# Patient Record
Sex: Female | Born: 1975 | Hispanic: No | Marital: Single
Health system: Southern US, Community
[De-identification: ages and names within clinical notes are randomized; demographics above are authoritative.]

---

## 2005-07-29 ENCOUNTER — Other Ambulatory Visit: Admission: RE | Admit: 2005-07-29 | Discharge: 2005-07-29 | Payer: Self-pay | Admitting: Obstetrics and Gynecology

## 2005-08-12 ENCOUNTER — Encounter: Admission: RE | Admit: 2005-08-12 | Discharge: 2005-08-12 | Payer: Self-pay | Admitting: Internal Medicine

## 2006-02-27 ENCOUNTER — Other Ambulatory Visit: Admission: RE | Admit: 2006-02-27 | Discharge: 2006-02-27 | Payer: Self-pay | Admitting: Obstetrics and Gynecology

## 2006-04-03 ENCOUNTER — Emergency Department (HOSPITAL_COMMUNITY): Admission: EM | Admit: 2006-04-03 | Discharge: 2006-04-03 | Payer: Self-pay | Admitting: Family Medicine

## 2006-11-30 ENCOUNTER — Other Ambulatory Visit: Admission: RE | Admit: 2006-11-30 | Discharge: 2006-11-30 | Payer: Self-pay | Admitting: Obstetrics and Gynecology

## 2007-07-25 ENCOUNTER — Other Ambulatory Visit: Admission: RE | Admit: 2007-07-25 | Discharge: 2007-07-25 | Payer: Self-pay | Admitting: Obstetrics and Gynecology

## 2007-07-31 ENCOUNTER — Encounter: Admission: RE | Admit: 2007-07-31 | Discharge: 2007-07-31 | Payer: Self-pay | Admitting: Obstetrics and Gynecology

## 2008-07-28 ENCOUNTER — Other Ambulatory Visit: Admission: RE | Admit: 2008-07-28 | Discharge: 2008-07-28 | Payer: Self-pay | Admitting: Obstetrics and Gynecology

## 2009-06-13 ENCOUNTER — Emergency Department (HOSPITAL_COMMUNITY): Admission: EM | Admit: 2009-06-13 | Discharge: 2009-06-13 | Payer: Self-pay | Admitting: Emergency Medicine

## 2009-06-15 ENCOUNTER — Encounter: Admission: RE | Admit: 2009-06-15 | Discharge: 2009-06-15 | Payer: Self-pay | Admitting: Internal Medicine

## 2010-05-10 ENCOUNTER — Other Ambulatory Visit: Admission: RE | Admit: 2010-05-10 | Discharge: 2010-05-10 | Payer: Self-pay | Admitting: Obstetrics and Gynecology

## 2010-09-19 ENCOUNTER — Encounter: Payer: Self-pay | Admitting: Internal Medicine

## 2010-12-02 LAB — URINALYSIS, ROUTINE W REFLEX MICROSCOPIC
Bilirubin Urine: NEGATIVE
Glucose, UA: NEGATIVE mg/dL
Hgb urine dipstick: NEGATIVE
Ketones, ur: NEGATIVE mg/dL
Nitrite: NEGATIVE
Protein, ur: NEGATIVE mg/dL
Specific Gravity, Urine: 1.008 (ref 1.005–1.030)
Urobilinogen, UA: 0.2 mg/dL (ref 0.0–1.0)
pH: 6.5 (ref 5.0–8.0)

## 2010-12-02 LAB — POCT PREGNANCY, URINE: Preg Test, Ur: NEGATIVE

## 2011-10-13 ENCOUNTER — Other Ambulatory Visit: Payer: Self-pay | Admitting: Obstetrics and Gynecology

## 2011-10-13 ENCOUNTER — Other Ambulatory Visit (HOSPITAL_COMMUNITY)
Admission: RE | Admit: 2011-10-13 | Discharge: 2011-10-13 | Disposition: A | Discharge status: Home / Self Care | Payer: BC Managed Care – PPO | Source: Ambulatory Visit | Attending: Obstetrics and Gynecology | Admitting: Obstetrics and Gynecology

## 2011-10-13 DIAGNOSIS — Z01419 Encounter for gynecological examination (general) (routine) without abnormal findings: Secondary | ICD-10-CM | POA: Insufficient documentation

## 2012-11-12 ENCOUNTER — Other Ambulatory Visit (HOSPITAL_COMMUNITY)
Admission: RE | Admit: 2012-11-12 | Discharge: 2012-11-12 | Disposition: A | Discharge status: Home / Self Care | Payer: BC Managed Care – PPO | Source: Ambulatory Visit | Attending: Obstetrics and Gynecology | Admitting: Obstetrics and Gynecology

## 2012-11-12 ENCOUNTER — Other Ambulatory Visit: Payer: Self-pay | Admitting: Obstetrics and Gynecology

## 2012-11-12 DIAGNOSIS — Z01419 Encounter for gynecological examination (general) (routine) without abnormal findings: Secondary | ICD-10-CM | POA: Insufficient documentation

## 2013-11-13 ENCOUNTER — Other Ambulatory Visit: Payer: Self-pay | Admitting: Obstetrics and Gynecology

## 2013-11-13 ENCOUNTER — Other Ambulatory Visit (HOSPITAL_COMMUNITY)
Admission: RE | Admit: 2013-11-13 | Discharge: 2013-11-13 | Disposition: A | Discharge status: Home / Self Care | Payer: BC Managed Care – PPO | Source: Ambulatory Visit | Attending: Obstetrics and Gynecology | Admitting: Obstetrics and Gynecology

## 2013-11-13 DIAGNOSIS — Z1151 Encounter for screening for human papillomavirus (HPV): Secondary | ICD-10-CM | POA: Insufficient documentation

## 2013-11-13 DIAGNOSIS — Z01419 Encounter for gynecological examination (general) (routine) without abnormal findings: Secondary | ICD-10-CM | POA: Insufficient documentation

## 2014-11-28 ENCOUNTER — Other Ambulatory Visit (HOSPITAL_COMMUNITY)
Admission: RE | Admit: 2014-11-28 | Discharge: 2014-11-28 | Disposition: A | Discharge status: Home / Self Care | Payer: BLUE CROSS/BLUE SHIELD | Source: Ambulatory Visit | Attending: Obstetrics and Gynecology | Admitting: Obstetrics and Gynecology

## 2014-11-28 ENCOUNTER — Other Ambulatory Visit: Payer: Self-pay | Admitting: Obstetrics and Gynecology

## 2014-11-28 DIAGNOSIS — Z01419 Encounter for gynecological examination (general) (routine) without abnormal findings: Secondary | ICD-10-CM | POA: Insufficient documentation

## 2014-12-01 LAB — CYTOLOGY - PAP

## 2015-12-18 DIAGNOSIS — R112 Nausea with vomiting, unspecified: Secondary | ICD-10-CM | POA: Diagnosis not present

## 2015-12-18 DIAGNOSIS — B349 Viral infection, unspecified: Secondary | ICD-10-CM | POA: Diagnosis not present

## 2015-12-28 ENCOUNTER — Other Ambulatory Visit: Payer: Self-pay | Admitting: Obstetrics and Gynecology

## 2015-12-28 ENCOUNTER — Other Ambulatory Visit (HOSPITAL_COMMUNITY)
Admission: RE | Admit: 2015-12-28 | Discharge: 2015-12-28 | Disposition: A | Discharge status: Home / Self Care | Payer: BLUE CROSS/BLUE SHIELD | Source: Ambulatory Visit | Attending: Obstetrics and Gynecology | Admitting: Obstetrics and Gynecology

## 2015-12-28 DIAGNOSIS — Z01419 Encounter for gynecological examination (general) (routine) without abnormal findings: Secondary | ICD-10-CM | POA: Diagnosis not present

## 2015-12-31 LAB — CYTOLOGY - PAP

## 2016-02-15 DIAGNOSIS — Z0000 Encounter for general adult medical examination without abnormal findings: Secondary | ICD-10-CM | POA: Diagnosis not present

## 2017-02-15 ENCOUNTER — Other Ambulatory Visit: Payer: Self-pay | Admitting: Obstetrics and Gynecology

## 2017-02-15 ENCOUNTER — Other Ambulatory Visit (HOSPITAL_COMMUNITY)
Admission: RE | Admit: 2017-02-15 | Discharge: 2017-02-15 | Disposition: A | Discharge status: Home / Self Care | Payer: BLUE CROSS/BLUE SHIELD | Source: Ambulatory Visit | Attending: Obstetrics and Gynecology | Admitting: Obstetrics and Gynecology

## 2017-02-15 DIAGNOSIS — Z01419 Encounter for gynecological examination (general) (routine) without abnormal findings: Secondary | ICD-10-CM | POA: Diagnosis not present

## 2017-02-15 DIAGNOSIS — Z124 Encounter for screening for malignant neoplasm of cervix: Secondary | ICD-10-CM | POA: Insufficient documentation

## 2017-02-17 LAB — CYTOLOGY - PAP
DIAGNOSIS: NEGATIVE
HPV: NOT DETECTED

## 2017-03-02 DIAGNOSIS — Z136 Encounter for screening for cardiovascular disorders: Secondary | ICD-10-CM | POA: Diagnosis not present

## 2017-03-02 DIAGNOSIS — Z0000 Encounter for general adult medical examination without abnormal findings: Secondary | ICD-10-CM | POA: Diagnosis not present

## 2018-02-16 DIAGNOSIS — Z01419 Encounter for gynecological examination (general) (routine) without abnormal findings: Secondary | ICD-10-CM | POA: Diagnosis not present

## 2018-03-09 ENCOUNTER — Other Ambulatory Visit: Payer: Self-pay | Admitting: Internal Medicine

## 2018-03-09 DIAGNOSIS — Z1231 Encounter for screening mammogram for malignant neoplasm of breast: Secondary | ICD-10-CM

## 2018-03-09 DIAGNOSIS — Z0000 Encounter for general adult medical examination without abnormal findings: Secondary | ICD-10-CM | POA: Diagnosis not present

## 2018-03-09 DIAGNOSIS — Z13220 Encounter for screening for lipoid disorders: Secondary | ICD-10-CM | POA: Diagnosis not present

## 2018-03-29 ENCOUNTER — Ambulatory Visit
Admission: RE | Admit: 2018-03-29 | Discharge: 2018-03-29 | Disposition: A | Discharge status: Home / Self Care | Payer: BLUE CROSS/BLUE SHIELD | Source: Ambulatory Visit | Attending: Internal Medicine | Admitting: Internal Medicine

## 2018-03-29 DIAGNOSIS — Z1231 Encounter for screening mammogram for malignant neoplasm of breast: Secondary | ICD-10-CM | POA: Diagnosis not present

## 2018-04-02 ENCOUNTER — Other Ambulatory Visit: Payer: Self-pay | Admitting: Internal Medicine

## 2018-04-02 DIAGNOSIS — R928 Other abnormal and inconclusive findings on diagnostic imaging of breast: Secondary | ICD-10-CM

## 2018-04-12 ENCOUNTER — Ambulatory Visit
Admission: RE | Admit: 2018-04-12 | Discharge: 2018-04-12 | Disposition: A | Discharge status: Home / Self Care | Payer: BLUE CROSS/BLUE SHIELD | Source: Ambulatory Visit | Attending: Internal Medicine | Admitting: Internal Medicine

## 2018-04-12 DIAGNOSIS — N6002 Solitary cyst of left breast: Secondary | ICD-10-CM | POA: Diagnosis not present

## 2018-04-12 DIAGNOSIS — R928 Other abnormal and inconclusive findings on diagnostic imaging of breast: Secondary | ICD-10-CM

## 2018-04-12 DIAGNOSIS — R922 Inconclusive mammogram: Secondary | ICD-10-CM | POA: Diagnosis not present

## 2018-08-14 ENCOUNTER — Ambulatory Visit
Admission: RE | Admit: 2018-08-14 | Discharge: 2018-08-14 | Disposition: A | Discharge status: Home / Self Care | Payer: BLUE CROSS/BLUE SHIELD | Source: Ambulatory Visit | Attending: Internal Medicine | Admitting: Internal Medicine

## 2018-08-14 ENCOUNTER — Other Ambulatory Visit: Payer: Self-pay | Admitting: Internal Medicine

## 2018-08-14 DIAGNOSIS — R109 Unspecified abdominal pain: Secondary | ICD-10-CM | POA: Diagnosis not present

## 2018-08-14 DIAGNOSIS — R1031 Right lower quadrant pain: Secondary | ICD-10-CM | POA: Diagnosis not present

## 2018-08-14 MED ORDER — IOPAMIDOL (ISOVUE-300) INJECTION 61%
100.0000 mL | Freq: Once | INTRAVENOUS | Status: AC | PRN
  Administered 2018-08-14: 100 mL via INTRAVENOUS

## 2018-08-15 ENCOUNTER — Other Ambulatory Visit: Payer: Self-pay | Admitting: General Surgery

## 2018-08-17 DIAGNOSIS — R1084 Generalized abdominal pain: Secondary | ICD-10-CM | POA: Diagnosis not present

## 2018-08-17 DIAGNOSIS — R933 Abnormal findings on diagnostic imaging of other parts of digestive tract: Secondary | ICD-10-CM | POA: Diagnosis not present

## 2018-09-04 DIAGNOSIS — K6389 Other specified diseases of intestine: Secondary | ICD-10-CM | POA: Diagnosis not present

## 2018-09-04 DIAGNOSIS — R933 Abnormal findings on diagnostic imaging of other parts of digestive tract: Secondary | ICD-10-CM | POA: Diagnosis not present

## 2018-09-04 DIAGNOSIS — R1084 Generalized abdominal pain: Secondary | ICD-10-CM | POA: Diagnosis not present

## 2018-09-04 DIAGNOSIS — K640 First degree hemorrhoids: Secondary | ICD-10-CM | POA: Diagnosis not present

## 2018-09-18 DIAGNOSIS — K388 Other specified diseases of appendix: Secondary | ICD-10-CM | POA: Diagnosis not present

## 2018-09-19 ENCOUNTER — Ambulatory Visit: Payer: Self-pay | Admitting: Surgery

## 2018-09-19 NOTE — H&P (Signed)
Tracy Cole Documented: 09/18/2018 11:18 AM Location: [facility] Patient #: [id number] DOB: 1976-04-24 Single / Language: [place] / Race: Undefined Female  History of Present Illness Tracy Cole(Tracy Cole C. Tracy Busey MD; 09/19/2018 5:29 PM) The patient is a 43 year old female who presents with appendicitis. Note for "Appendicitis": ` ` ` Patient sent for surgical consultation at the request of Dr Tracy Cole  Chief Complaint: Abdominal pain with inverted appendix. ` ` The patient is a pleasant woman that had an episode of sharp pain in November. Seem to go away but then had a more intense episode last month. Initially felt diffuse crampiness but pain seen more focal in the right lower quadrant. Listed for a week. Eventually was evaluated. CT scan showed an inflamed inverted appendix intussuscepting into the cecum. No definite perforation or abscess. Sent for consultation with gastroenterology. Dr. Bosie Cole did endoscopy and could note a small tip in the lumen. We will consultation requested given episodes of intermittent pain. The patient comes in today by herself. She is originally from [place] but is spent most of her life in [place]. Does not recall any pediatric events. No history of any infectious disease. No abdominal surgeries as a child. Definitely no appendectomy. She had a C-section. No appendix removed at that time. She moves her bowels about twice a week. Occasionally takes a laxative help move her bowels. She is rather physically active. No smoking nor alcohol.  No personal nor family history of GI/colon cancer, inflammatory bowel disease, irritable bowel syndrome, allergy such as Celiac Sprue, dietary/dairy problems, colitis, ulcers nor gastritis. No recent sick contacts/gastroenteritis. No travel outside the country. No changes in diet. No dysphagia to solids or liquids. No significant heartburn or reflux. No hematochezia, hematemesis, coffee ground emesis. No  evidence of prior gastric/peptic ulceration.  (Review of systems as stated in this history (HPI) or in the review of systems. Otherwise all other 12 point ROS are negative) ` ` `   Past Surgical History Tracy Cole(Tracy Cole, CMA; 09/18/2018 11:18 AM) Cesarean Section - 1  Diagnostic Studies History Tracy Cole(Tracy Cole, CMA; 09/18/2018 11:18 AM) Colonoscopy within last year Mammogram within last year Pap Smear 1-5 years ago  Allergies Tracy Cole(Tracy Cole, CMA; 09/18/2018 11:19 AM) No Known Allergies [09/18/2018]: No Known Drug Allergies [09/18/2018]: Allergies Reconciled  Medication History Tracy Cole(Tracy Cole, CMA; 09/18/2018 11:19 AM) No Current Medications Medications Reconciled  Social History Tracy Cole(Tracy Cole C. Tocarra Gassen, MD; 09/19/2018 5:21 PM) Caffeine use Carbonated beverages, Coffee, Tea. No alcohol use No drug use Tobacco use Never smoker. Originally from [place]. In [place] since mid 1990s.  Family History Tracy Cole(Tracy Cole, CMA; 09/18/2018 11:18 AM) Diabetes Mellitus Son.  Pregnancy / Birth History Tracy Cole(Tracy Cole, CMA; 09/18/2018 11:18 AM) Age at menarche 16 years. Length (months) of breastfeeding 3-6 Regular periods  Other Problems Tracy Cole(Tracy Cole, CMA; 09/18/2018 11:18 AM) No pertinent past medical history     Review of Systems (Tracy Cole CMA; 09/18/2018 11:18 AM) General Not Present- Appetite Loss, Chills, Fatigue, Fever, Night Sweats, Weight Gain and Weight Loss. Skin Not Present- Change in Wart/Mole, Dryness, Hives, Jaundice, New Lesions, Non-Healing Wounds, Rash and Ulcer. HEENT Not Present- Earache, Hearing Loss, Hoarseness, Nose Bleed, Oral Ulcers, Ringing in the Ears, Seasonal Allergies, Sinus Pain, Sore Throat, Visual Disturbances, Wears glasses/contact lenses and Yellow Eyes. Respiratory Not Present- Bloody sputum, Chronic Cough, Difficulty Breathing, Snoring and Wheezing. Breast Not Present- Breast Mass, Breast Pain, Nipple Discharge and Skin Changes. Cardiovascular  Not Present- Chest Pain, Difficulty Breathing Lying Down, Leg Cramps, Palpitations, Rapid Heart  Rate, Shortness of Breath and Swelling of Extremities. Female Genitourinary Not Present- Frequency, Nocturia, Painful Urination, Pelvic Pain and Urgency. Musculoskeletal Not Present- Back Pain, Joint Pain, Joint Stiffness, Muscle Pain, Muscle Weakness and Swelling of Extremities. Neurological Not Present- Decreased Memory, Fainting, Headaches, Numbness, Seizures, Tingling, Tremor, Trouble walking and Weakness. Endocrine Not Present- Cold Intolerance, Excessive Hunger, Hair Changes, Heat Intolerance, Hot flashes and New Diabetes.  Vitals (Tracy Cole CMA; 09/18/2018 11:20 AM) 09/18/2018 11:19 AM Weight: 126.25 lb Height: 65in Body Surface Area: 1.63 m Body Mass Index: 21.01 kg/m  Temp.: 97.66F(Temporal)  Pulse: 99 (Regular)  BP: 118/74 (Sitting, Right Arm, Standard)      Physical Exam Tracy Sportsman MD; 09/19/2018 5:26 PM)  General Mental Status-Alert. General Appearance-Not in acute distress, Not Sickly. Orientation-Oriented X3. Hydration-Well hydrated. Voice-Normal. Note: thin but not cachectic. Moves around easily. Not toxic. Not sickly.  Integumentary Global Assessment Upon inspection and palpation of skin surfaces of the - Axillae: non-tender, no inflammation or ulceration, no drainage. and Distribution of scalp and body hair is normal. General Characteristics Temperature - normal warmth is noted.  Head and Neck Head-normocephalic, atraumatic with no lesions or palpable masses. Face Global Assessment - atraumatic, no absence of expression. Neck Global Assessment - no abnormal movements, no bruit auscultated on the right, no bruit auscultated on the left, no decreased range of motion, non-tender. Trachea-midline. Thyroid Gland Characteristics - non-tender.  Eye Eyeball - Left-Extraocular movements intact, No Nystagmus. Eyeball -  Right-Extraocular movements intact, No Nystagmus. Cornea - Left-No Hazy. Cornea - Right-No Hazy. Sclera/Conjunctiva - Left-No scleral icterus, No Discharge. Sclera/Conjunctiva - Right-No scleral icterus, No Discharge. Pupil - Left-Direct reaction to light normal. Pupil - Right-Direct reaction to light normal.  ENMT Ears Pinna - Left - no drainage observed, no generalized tenderness observed. Right - no drainage observed, no generalized tenderness observed. Nose and Sinuses External Inspection of the Nose - no destructive lesion observed. Inspection of the nares - Left - quiet respiration. Right - quiet respiration. Mouth and Throat Lips - Upper Lip - no fissures observed, no pallor noted. Lower Lip - no fissures observed, no pallor noted. Nasopharynx - no discharge present. Oral Cavity/Oropharynx - Tongue - no dryness observed. Oral Mucosa - no cyanosis observed. Hypopharynx - no evidence of airway distress observed.  Chest and Lung Exam Inspection Movements - Normal and Symmetrical. Accessory muscles - No use of accessory muscles in breathing. Palpation Palpation of the chest reveals - Non-tender. Auscultation Breath sounds - Normal and Clear.  Cardiovascular Auscultation Rhythm - Regular. Murmurs & Other Heart Sounds - Auscultation of the heart reveals - No Murmurs and No Systolic Clicks.  Abdomen Inspection Inspection of the abdomen reveals - No Visible peristalsis and No Abnormal pulsations. Umbilicus - No Bleeding, No Urine drainage. Palpation/Percussion Palpation and Percussion of the abdomen reveal - Soft, Non Tender, No Rebound tenderness, No Rigidity (guarding) and No Cutaneous hyperesthesia. Note: tenderness to operation right lower quadrant with some voluntary guarding. No diffuse peritonitis though.  Rest of the abdomen soft and nontender. Not severely distended. No distasis recti. No umbilical or other anterior abdominal wall hernias  Female  Genitourinary Sexual Maturity Tanner 5 - Adult hair pattern. Note: No vaginal bleeding nor discharge  Peripheral Vascular Upper Extremity Inspection - Left - No Cyanotic nailbeds, Not Ischemic. Right - No Cyanotic nailbeds, Not Ischemic.  Neurologic Neurologic evaluation reveals -normal attention span and ability to concentrate, able to name objects and repeat phrases. Appropriate fund of knowledge , normal sensation  and normal coordination. Mental Status Affect - not angry, not paranoid. Cranial Nerves-Normal Bilaterally. Gait-Normal.  Neuropsychiatric Mental status exam performed with findings of-able to articulate well with normal speech/language, rate, volume and coherence, thought content normal with ability to perform basic computations and apply abstract reasoning and no evidence of hallucinations, delusions, obsessions or homicidal/suicidal ideation.  Musculoskeletal Global Assessment Spine, Ribs and Pelvis - no instability, subluxation or laxity. Right Upper Extremity - no instability, subluxation or laxity.  Lymphatic Head & Neck  General Head & Neck Lymphatics: Bilateral - Description - No Localized lymphadenopathy. Axillary  General Axillary Region: Bilateral - Description - No Localized lymphadenopathy. Femoral & Inguinal  Generalized Femoral & Inguinal Lymphatics: Left - Description - No Localized lymphadenopathy. Right - Description - No Localized lymphadenopathy.    Assessment & Plan Tracy Cole(Daisy Lites C. 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 Two episodes of severe pain. No definite tumor by CT scan or colonoscopy. No history of prior appendectomy other surgery.  Splenic suture time reviewing films and discussed with patient. Discussed with my other colorectal partner, Dr. Maisie Fushomas. Sometimes they will auto infarct in amputate most of it off. However  she has persistent discomfort. She intussuscepted intraluminally a fair amount such that only a small bit of the tip is exposed and a luminally. Because she has persistent discomfort several months out from her initial event, I am concerned about leaving this alone. I think she would benefit from noninvasive resection. I am skeptical that him going to be able to reduce the appendix intussusception and just do a partial septectomy/appendectomy. More likely on need to do an ileocecectomy. We will see intraoperatively.  She was not quite ready to make a decision but is leaning toward surgery since she does concede that she has had two severe painful attacks and has had symptoms for three months. She will let us know. I will tentatively post this under the presumption she wishes to proceed with surgery.   PREOP COLON - ENCOUNTER FOR PREOPERATIVE EXAMINATION FOR GENERAL SURGICAL PROCEDURE (Z01.818)  Current Plans You are being scheduled for surgery- Our schedulers will call you.  You should hear from our office's scheduling department within 5 working days about the location, date, and time of surgery. We try to make accommodations for patient's preferences in scheduling surgery, but sometimes the OR schedule or the surgeon's schedule prevents us from making those accommodations.  If you have not heard from our office [phone]) in 5 working days, call the office and ask for your surgeon's nurse.  If you have other questions about your diagnosis, plan, or surgery, call the office and ask for your surgeon's nurse.  Written instructions provided The anatomy & physiology of the digestive tract was discussed. The pathophysiology of the colon was discussed. Natural history risks without surgery was discussed. I feel the risks of no intervention will lead to serious problems that outweigh the operative risks; therefore, I recommended a partial colectomy to remove the pathology. Minimally invasive  (Robotic/Laparoscopic) & open techniques were discussed.  Risks such as bleeding, infection, abscess, leak, reoperation, possible ostomy, hernia, heart attack, death, and other risks were discussed. I noted a good likelihood this will help address the problem. Goals of post-operative recovery were discussed as well. Need for adequate nutrition, daily bowel regimen and healthy physical activity, to optimize recovery was noted as well. We will work to minimize complications. Educational materials were available as well.  Questions were answered. The patient expresses understanding & wishes to proceed with surgery.  Pt Education - [facility] Colon Bowel Prep 2018 ERAS/Miralax/Antibiotics Started Neomycin Sulfate 500 MG Oral Tablet, 2 (two) Tablet SEE NOTE, #6, 09/19/2018, No Refill. Local Order: TAKE TWO TABLETS AT 2 PM, 3 PM, AND 10 PM THE DAY PRIOR TO SURGERY Started Flagyl 500 MG Oral Tablet, 2 (two) Tablet SEE NOTE, #6, 09/19/2018, No Refill. Local Order: Take at 2pm, 3pm, and 10pm the day prior to your colon operation Pt Education - Pamphlet Given - Laparoscopic Colorectal Surgery: discussed with patient and provided information. Pt Education - [facility] Colectomy post-op instructions: discussed with patient and provided information.  Tracy Sportsman, MD, FACS, MASCRS Gastrointestinal and Minimally Invasive Surgery    [address], [address] [place], [place] [address] [phone] Main / Paging [phone] Fax

## 2019-04-18 ENCOUNTER — Other Ambulatory Visit: Payer: Self-pay

## 2019-04-18 ENCOUNTER — Other Ambulatory Visit (HOSPITAL_COMMUNITY): Payer: Self-pay | Admitting: Internal Medicine

## 2019-04-18 ENCOUNTER — Other Ambulatory Visit: Payer: Self-pay | Admitting: Internal Medicine

## 2019-04-18 ENCOUNTER — Ambulatory Visit (HOSPITAL_COMMUNITY): Payer: Medicaid Other | Attending: Cardiovascular Disease

## 2019-04-18 DIAGNOSIS — Z1231 Encounter for screening mammogram for malignant neoplasm of breast: Secondary | ICD-10-CM

## 2019-04-18 DIAGNOSIS — R9431 Abnormal electrocardiogram [ECG] [EKG]: Secondary | ICD-10-CM

## 2019-05-31 ENCOUNTER — Ambulatory Visit: Payer: Medicaid Other

## 2019-07-15 ENCOUNTER — Ambulatory Visit
Admission: RE | Admit: 2019-07-15 | Discharge: 2019-07-15 | Disposition: A | Discharge status: Home / Self Care | Payer: Medicaid Other | Source: Ambulatory Visit | Attending: Internal Medicine | Admitting: Internal Medicine

## 2019-07-15 ENCOUNTER — Other Ambulatory Visit: Payer: Self-pay

## 2019-07-15 DIAGNOSIS — Z1231 Encounter for screening mammogram for malignant neoplasm of breast: Secondary | ICD-10-CM

## 2019-07-17 ENCOUNTER — Other Ambulatory Visit: Payer: Self-pay | Admitting: Internal Medicine

## 2019-07-17 DIAGNOSIS — R928 Other abnormal and inconclusive findings on diagnostic imaging of breast: Secondary | ICD-10-CM

## 2019-07-19 ENCOUNTER — Other Ambulatory Visit: Payer: Self-pay

## 2019-07-19 ENCOUNTER — Ambulatory Visit
Admission: RE | Admit: 2019-07-19 | Discharge: 2019-07-19 | Disposition: A | Discharge status: Home / Self Care | Payer: Medicaid Other | Source: Ambulatory Visit | Attending: Internal Medicine | Admitting: Internal Medicine

## 2019-07-19 DIAGNOSIS — R928 Other abnormal and inconclusive findings on diagnostic imaging of breast: Secondary | ICD-10-CM

## 2020-09-03 ENCOUNTER — Other Ambulatory Visit: Payer: Self-pay | Admitting: Internal Medicine

## 2020-09-03 DIAGNOSIS — Z1231 Encounter for screening mammogram for malignant neoplasm of breast: Secondary | ICD-10-CM

## 2020-10-15 ENCOUNTER — Other Ambulatory Visit: Payer: Self-pay

## 2020-10-15 ENCOUNTER — Ambulatory Visit
Admission: RE | Admit: 2020-10-15 | Discharge: 2020-10-15 | Disposition: A | Discharge status: Home / Self Care | Payer: Medicaid Other | Source: Ambulatory Visit | Attending: Internal Medicine | Admitting: Internal Medicine

## 2020-10-15 DIAGNOSIS — Z1231 Encounter for screening mammogram for malignant neoplasm of breast: Secondary | ICD-10-CM

## 2020-10-22 ENCOUNTER — Other Ambulatory Visit: Payer: Self-pay | Admitting: Internal Medicine

## 2020-10-22 DIAGNOSIS — R928 Other abnormal and inconclusive findings on diagnostic imaging of breast: Secondary | ICD-10-CM

## 2020-11-16 ENCOUNTER — Ambulatory Visit
Admission: RE | Admit: 2020-11-16 | Discharge: 2020-11-16 | Disposition: A | Discharge status: Home / Self Care | Payer: Managed Care, Other (non HMO) | Source: Ambulatory Visit | Attending: Internal Medicine | Admitting: Internal Medicine

## 2020-11-16 ENCOUNTER — Other Ambulatory Visit: Payer: Self-pay | Admitting: Internal Medicine

## 2020-11-16 ENCOUNTER — Other Ambulatory Visit: Payer: Self-pay

## 2020-11-16 DIAGNOSIS — R928 Other abnormal and inconclusive findings on diagnostic imaging of breast: Secondary | ICD-10-CM

## 2021-05-20 ENCOUNTER — Ambulatory Visit
Admission: RE | Admit: 2021-05-20 | Discharge: 2021-05-20 | Disposition: A | Discharge status: Home / Self Care | Payer: Managed Care, Other (non HMO) | Source: Ambulatory Visit | Attending: Internal Medicine | Admitting: Internal Medicine

## 2021-05-20 ENCOUNTER — Other Ambulatory Visit: Payer: Self-pay | Admitting: Internal Medicine

## 2021-05-20 ENCOUNTER — Other Ambulatory Visit: Payer: Self-pay

## 2021-05-20 DIAGNOSIS — R928 Other abnormal and inconclusive findings on diagnostic imaging of breast: Secondary | ICD-10-CM

## 2021-05-20 DIAGNOSIS — R921 Mammographic calcification found on diagnostic imaging of breast: Secondary | ICD-10-CM

## 2021-11-18 ENCOUNTER — Ambulatory Visit
Admission: RE | Admit: 2021-11-18 | Discharge: 2021-11-18 | Disposition: A | Discharge status: Home / Self Care | Payer: Managed Care, Other (non HMO) | Source: Ambulatory Visit | Attending: Internal Medicine | Admitting: Internal Medicine

## 2021-11-18 DIAGNOSIS — R921 Mammographic calcification found on diagnostic imaging of breast: Secondary | ICD-10-CM

## 2022-05-20 ENCOUNTER — Ambulatory Visit
Admission: RE | Admit: 2022-05-20 | Discharge: 2022-05-20 | Disposition: A | Discharge status: Home / Self Care | Payer: Managed Care, Other (non HMO) | Source: Ambulatory Visit | Attending: Internal Medicine | Admitting: Internal Medicine

## 2022-05-20 ENCOUNTER — Other Ambulatory Visit: Payer: Self-pay | Admitting: Internal Medicine

## 2022-05-20 DIAGNOSIS — R140 Abdominal distension (gaseous): Secondary | ICD-10-CM

## 2022-10-31 ENCOUNTER — Other Ambulatory Visit: Payer: Self-pay | Admitting: Internal Medicine

## 2022-10-31 DIAGNOSIS — R921 Mammographic calcification found on diagnostic imaging of breast: Secondary | ICD-10-CM

## 2022-12-14 ENCOUNTER — Ambulatory Visit
Admission: RE | Admit: 2022-12-14 | Discharge: 2022-12-14 | Disposition: A | Discharge status: Home / Self Care | Payer: Managed Care, Other (non HMO) | Source: Ambulatory Visit | Attending: Internal Medicine | Admitting: Internal Medicine

## 2022-12-14 DIAGNOSIS — R921 Mammographic calcification found on diagnostic imaging of breast: Secondary | ICD-10-CM

## 2023-01-24 ENCOUNTER — Other Ambulatory Visit: Payer: Self-pay | Admitting: Obstetrics and Gynecology

## 2023-06-09 IMAGING — MG DIGITAL DIAGNOSTIC BILAT W/ TOMO W/ CAD
6 of 10 series · 6 of 26 positions shown · non-contrast
Comparison: Previous exam(s).

CLINICAL DATA: One year interval follow-up of likely benign
calcifications involving the upper RIGHT breast. Annual evaluation,
LEFT breast.

EXAM:
DIGITAL DIAGNOSTIC BILATERAL MAMMOGRAM WITH TOMOSYNTHESIS AND CAD
TECHNIQUE: Bilateral digital diagnostic mammography and breast tomosynthesis
was performed. The images were evaluated with computer-aided
detection.

[R ML]
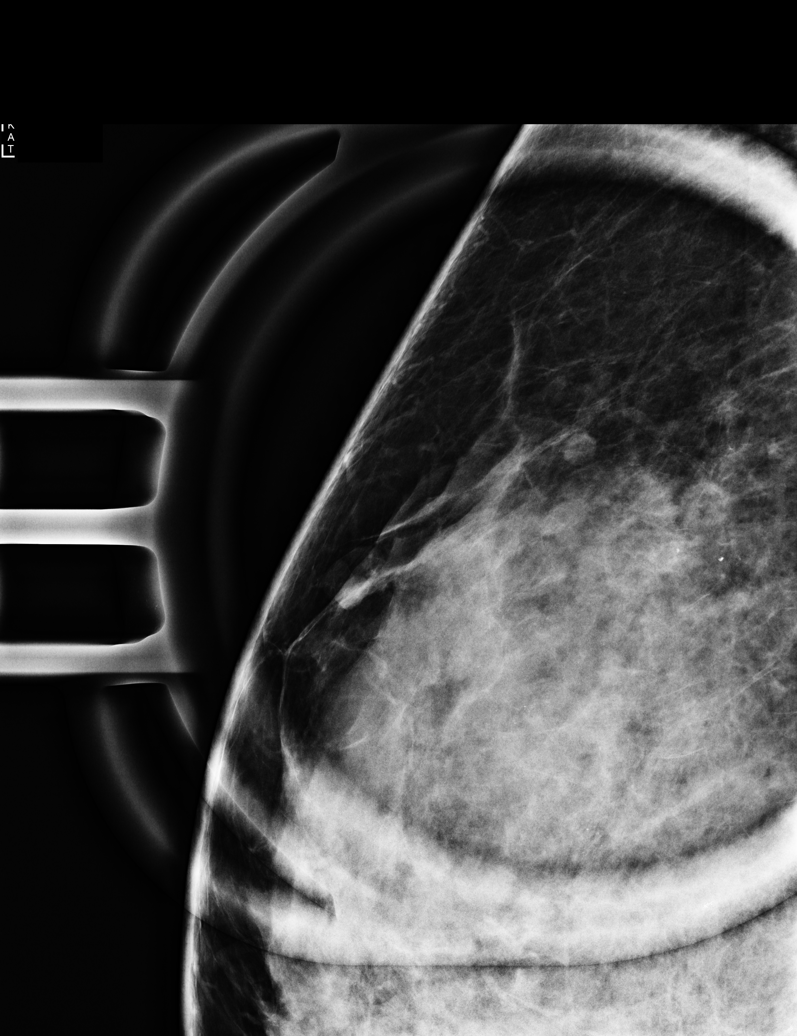

[R CC]
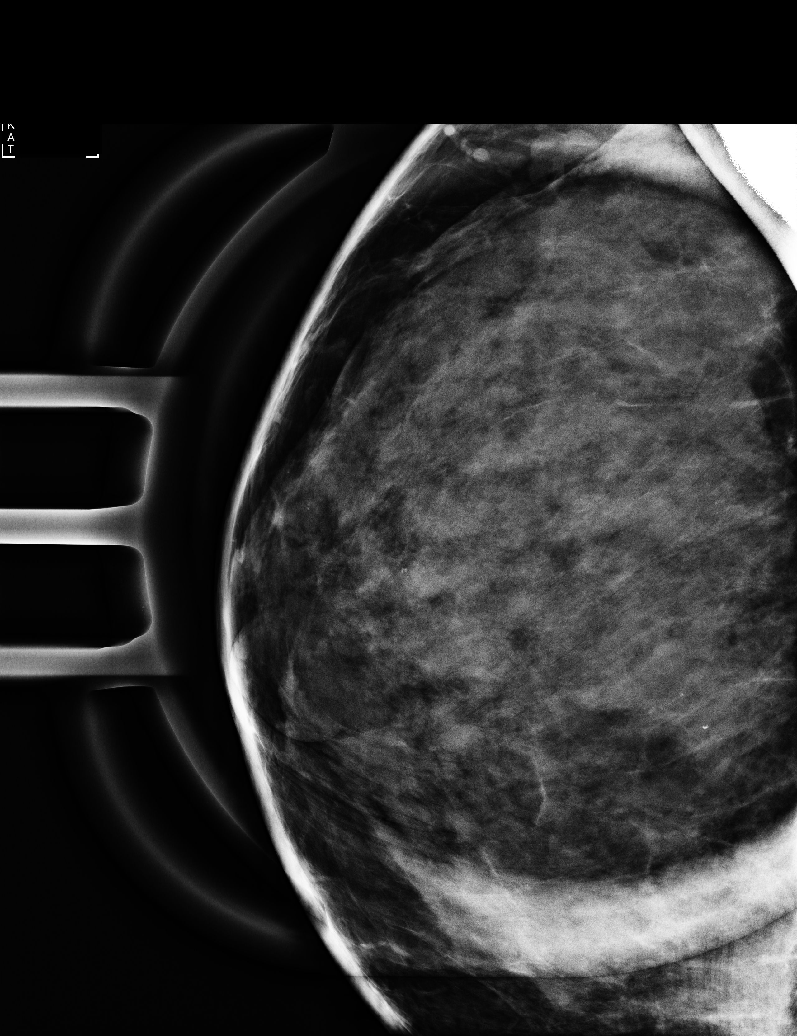

[R MLO synth-2D]
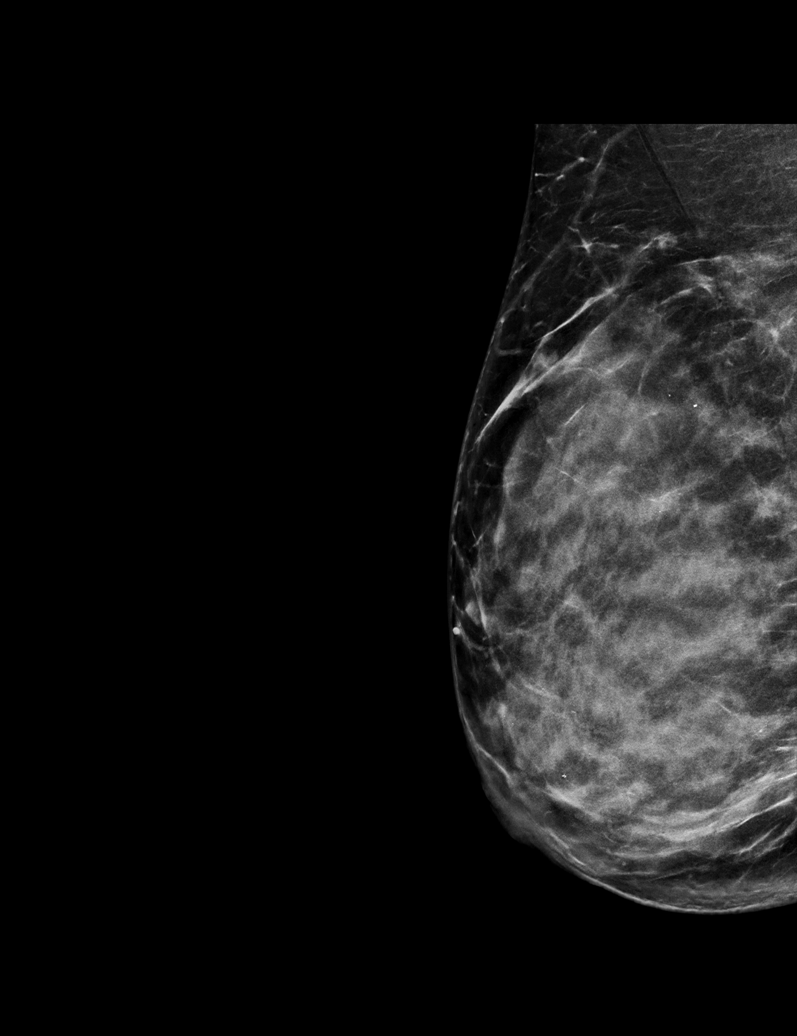

[R CC synth-2D]
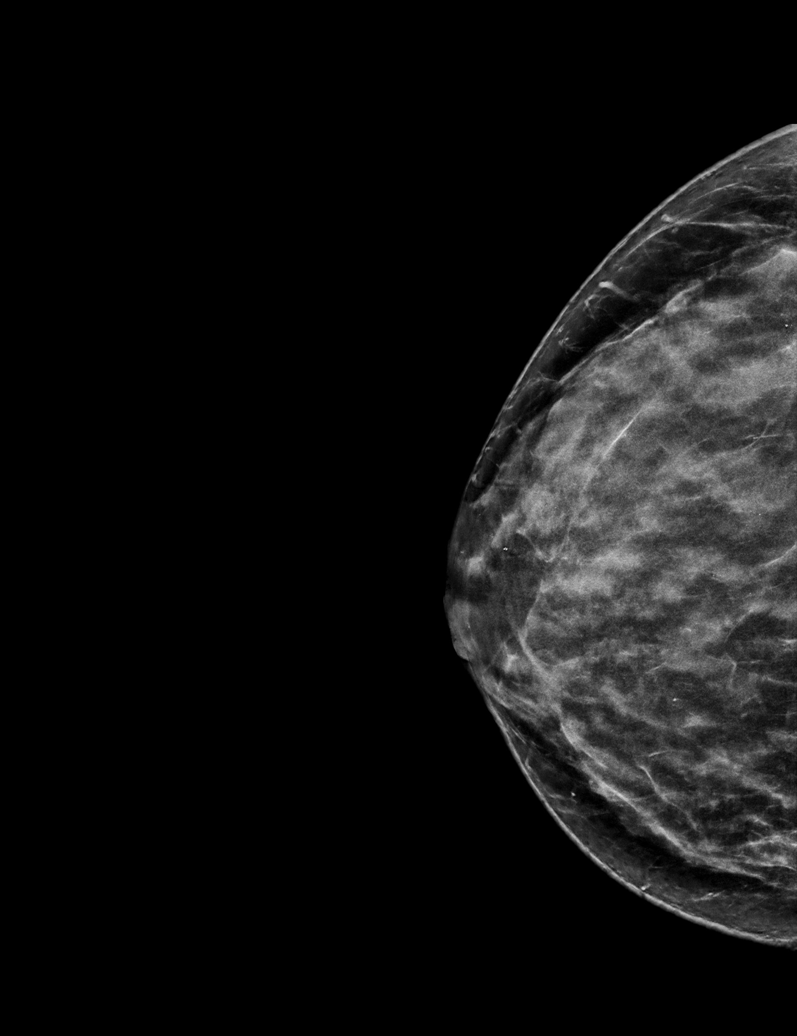

[L CC synth-2D]
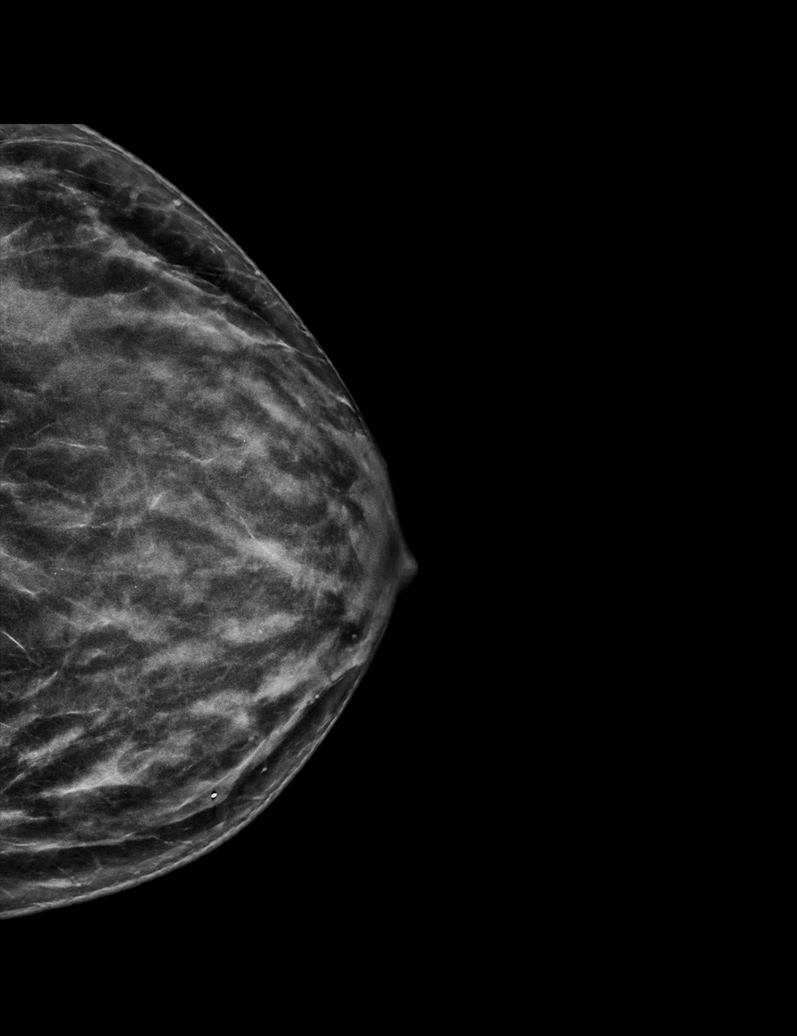

[L MLO synth-2D]
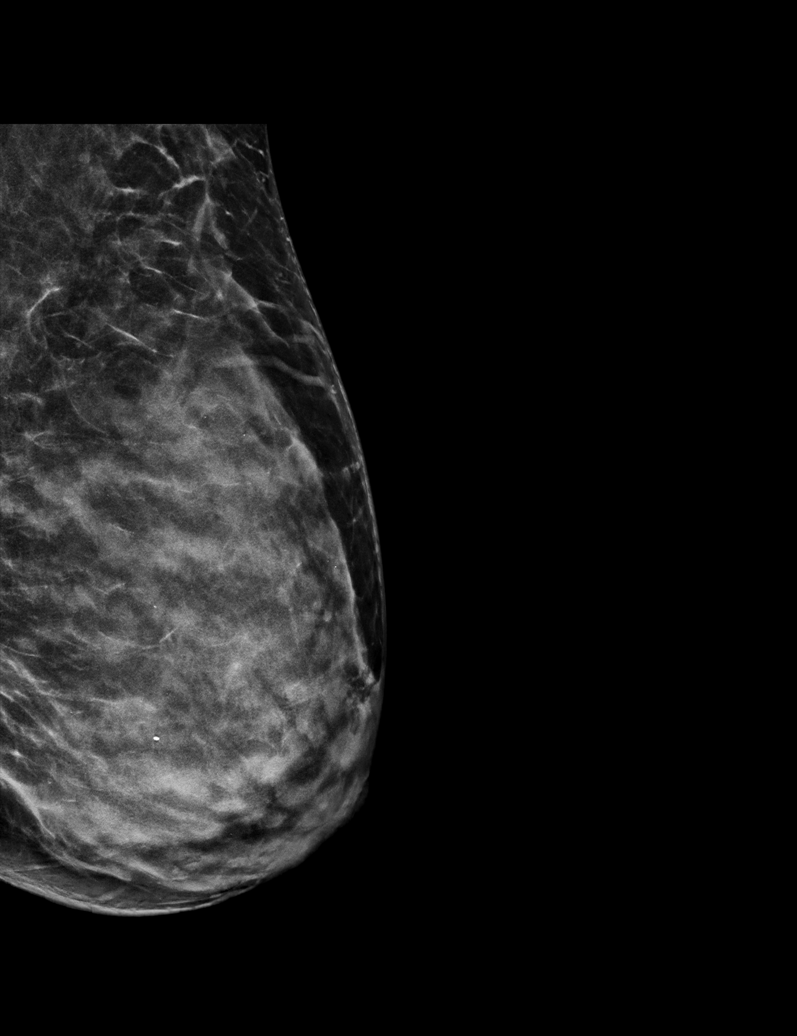

[6 of 26 positions shown; findings below may reference images not displayed]

ACR Breast Density Category d: The breast tissue is extremely dense,
which lowers the sensitivity of mammography.
FINDINGS: Full field CC and MLO views of both breasts and spot magnification
CC and mediolateral view of the RIGHT breast calcifications were
obtained.

RIGHT: The loosely grouped faint calcifications in the upper breast
at middle to posterior depth which have a round morphology on the CC
view, some of which demonstrate layering on the mediolateral view,
are unchanged dating back to the October 2020 examination. There are
no new suspicious linear or branching forms. The adjacent benign
coarse dystrophic appearing calcifications which are superomedial to
the loosely grouped calcifications are similarly unchanged.

No new or suspicious findings elsewhere.

LEFT: No findings suspicious for malignancy.
IMPRESSION: 1. Stable likely benign loosely grouped calcifications involving the
upper RIGHT breast at middle to posterior depth.
2. No mammographic evidence of malignancy involving the LEFT breast.

RECOMMENDATION:
BILATERAL diagnostic mammography in 1 year with spot magnification
views of the RIGHT breast calcifications at that time (in order to
confirm 2 years of stability).

I have discussed the findings and recommendations with the patient.
If applicable, a reminder letter will be sent to the patient
regarding the next appointment.

BI-RADS CATEGORY  3: Probably benign.

## 2023-11-07 ENCOUNTER — Other Ambulatory Visit: Payer: Self-pay | Admitting: Internal Medicine

## 2023-11-07 DIAGNOSIS — Z1231 Encounter for screening mammogram for malignant neoplasm of breast: Secondary | ICD-10-CM

## 2023-12-15 ENCOUNTER — Ambulatory Visit
Admission: RE | Admit: 2023-12-15 | Discharge: 2023-12-15 | Disposition: A | Discharge status: Home / Self Care | Source: Ambulatory Visit | Attending: Internal Medicine | Admitting: Internal Medicine

## 2023-12-15 DIAGNOSIS — Z1231 Encounter for screening mammogram for malignant neoplasm of breast: Secondary | ICD-10-CM

## 2023-12-19 ENCOUNTER — Other Ambulatory Visit: Payer: Self-pay | Admitting: Internal Medicine

## 2023-12-19 DIAGNOSIS — Z1231 Encounter for screening mammogram for malignant neoplasm of breast: Secondary | ICD-10-CM

## 2023-12-21 ENCOUNTER — Other Ambulatory Visit: Payer: Self-pay | Admitting: Internal Medicine

## 2023-12-21 DIAGNOSIS — R928 Other abnormal and inconclusive findings on diagnostic imaging of breast: Secondary | ICD-10-CM

## 2024-01-01 ENCOUNTER — Other Ambulatory Visit

## 2024-01-03 ENCOUNTER — Ambulatory Visit
Admission: RE | Admit: 2024-01-03 | Discharge: 2024-01-03 | Disposition: A | Discharge status: Home / Self Care | Source: Ambulatory Visit | Attending: Internal Medicine | Admitting: Internal Medicine

## 2024-01-03 ENCOUNTER — Other Ambulatory Visit (HOSPITAL_COMMUNITY)
Admission: RE | Admit: 2024-01-03 | Discharge: 2024-01-03 | Disposition: A | Discharge status: Home / Self Care | Source: Ambulatory Visit | Attending: Obstetrics and Gynecology | Admitting: Obstetrics and Gynecology

## 2024-01-03 ENCOUNTER — Other Ambulatory Visit: Payer: Self-pay | Admitting: Internal Medicine

## 2024-01-03 ENCOUNTER — Other Ambulatory Visit: Payer: Self-pay | Admitting: Obstetrics and Gynecology

## 2024-01-03 DIAGNOSIS — Z01419 Encounter for gynecological examination (general) (routine) without abnormal findings: Secondary | ICD-10-CM | POA: Diagnosis present

## 2024-01-03 DIAGNOSIS — R928 Other abnormal and inconclusive findings on diagnostic imaging of breast: Secondary | ICD-10-CM

## 2024-01-09 LAB — CYTOLOGY - PAP
Comment: NEGATIVE
Diagnosis: NEGATIVE
High risk HPV: NEGATIVE
